# Patient Record
Sex: Male | Born: 1950 | Race: White | Hispanic: No | Smoking: Never smoker
Health system: Southern US, Community
[De-identification: ages and names within clinical notes are randomized; demographics above are authoritative.]

---

## 2011-09-14 ENCOUNTER — Encounter (HOSPITAL_COMMUNITY): Payer: Self-pay

## 2011-09-14 ENCOUNTER — Emergency Department (HOSPITAL_COMMUNITY): Payer: BC Managed Care – PPO

## 2011-09-14 ENCOUNTER — Emergency Department (HOSPITAL_COMMUNITY)
Admission: EM | Admit: 2011-09-14 | Discharge: 2011-09-15 | Disposition: A | Discharge status: Home / Self Care | Payer: BC Managed Care – PPO | Attending: Emergency Medicine | Admitting: Emergency Medicine

## 2011-09-14 DIAGNOSIS — M79609 Pain in unspecified limb: Secondary | ICD-10-CM | POA: Insufficient documentation

## 2011-09-14 DIAGNOSIS — M79606 Pain in leg, unspecified: Secondary | ICD-10-CM

## 2011-09-14 DIAGNOSIS — R079 Chest pain, unspecified: Secondary | ICD-10-CM | POA: Insufficient documentation

## 2011-09-14 DIAGNOSIS — Z880 Allergy status to penicillin: Secondary | ICD-10-CM | POA: Insufficient documentation

## 2011-09-14 DIAGNOSIS — B368 Other specified superficial mycoses: Secondary | ICD-10-CM | POA: Insufficient documentation

## 2011-09-14 LAB — COMPREHENSIVE METABOLIC PANEL
ALT: 12 U/L (ref 0–53)
AST: 16 U/L (ref 0–37)
Albumin: 4.1 g/dL (ref 3.5–5.2)
CO2: 23 mEq/L (ref 19–32)
Calcium: 9.6 mg/dL (ref 8.4–10.5)
Sodium: 135 mEq/L (ref 135–145)
Total Protein: 7.4 g/dL (ref 6.0–8.3)

## 2011-09-14 LAB — CBC WITH DIFFERENTIAL/PLATELET
Basophils Absolute: 0 10*3/uL (ref 0.0–0.1)
Basophils Relative: 0 % (ref 0–1)
Eosinophils Relative: 0 % (ref 0–5)
Lymphocytes Relative: 13 % (ref 12–46)
MCV: 89 fL (ref 78.0–100.0)
Platelets: 228 10*3/uL (ref 150–400)
RDW: 13 % (ref 11.5–15.5)
WBC: 10.6 10*3/uL — ABNORMAL HIGH (ref 4.0–10.5)

## 2011-09-14 MED ORDER — HYDROMORPHONE HCL PF 2 MG/ML IJ SOLN
2.0000 mg | Freq: Once | INTRAMUSCULAR | Status: AC
  Administered 2011-09-14: 2 mg via INTRAMUSCULAR
  Filled 2011-09-14: qty 1

## 2011-09-14 MED ORDER — ONDANSETRON 4 MG PO TBDP
8.0000 mg | ORAL_TABLET | Freq: Once | ORAL | Status: AC
  Administered 2011-09-14: 8 mg via ORAL
  Filled 2011-09-14: qty 2

## 2011-09-14 NOTE — ED Notes (Signed)
Pt complains of chest pain and leg pain, no pain at present in chest but legs do hurt, sts that pain started after working yesterday, was seen at clinic thismorning and given vicodin and antiinflammatory.

## 2011-09-14 NOTE — ED Provider Notes (Signed)
History     CSN: [id number]  Arrival date & time 09/14/11  1701   First MD Initiated Contact with Patient 09/14/11 2330      Chief Complaint  Patient presents with  . Chest Pain    (Consider location/radiation/quality/duration/timing/severity/associated sxs/prior treatment) HPI Comments: Cole Eastridge is a 61 y.o. Male who complains of lower leg pain bilaterally that started yesterday. He took ibuprofen and Aleve without relief. He went to a clinic today in [place] and was prescribed hydrocodone and meloxicam. Hydrocodone helps some but then the pain returns. He had an episode of chest pain, for 1 minute yesterday, while mowing the lawn. He has no associated shortness of breath, weakness, dizziness, nausea, or vomiting. There are no known aggravating or othe palliative factors.   Patient is a 61 y.o. male presenting with chest pain. The history is provided by the patient.  Chest Pain     No past medical history on file.  No past surgical history on file.  No family history on file.  History  Substance Use Topics  . Smoking status: Never Smoker   . Smokeless tobacco: Not on file  . Alcohol Use: No      Review of Systems  Cardiovascular: Positive for chest pain.  All other systems reviewed and are negative.    Allergies  Penicillins  Home Medications   Current Outpatient Rx  Name Route Sig Dispense Refill  . MELOXICAM 7.5 MG PO TABS Oral Take 7.5 mg by mouth 2 (two) times daily.    . OXYCODONE-ACETAMINOPHEN 5-325 MG PO TABS Oral Take 1 tablet by mouth every 4 (four) hours as needed for pain. 30 tablet 0    BP 132/74  Pulse 52  Temp 98.7 F (37.1 C) (Oral)  Resp 20  SpO2 96%  Physical Exam  Nursing note and vitals reviewed. Constitutional: He is oriented to person, place, and time. He appears well-developed and well-nourished.  HENT:  Head: Normocephalic and atraumatic.  Right Ear: External ear normal.  Left Ear: External ear normal.  Eyes:  Conjunctivae and EOM are normal. Pupils are equal, round, and reactive to light.  Neck: Normal range of motion and phonation normal. Neck supple.  Cardiovascular: Normal rate, regular rhythm, normal heart sounds and intact distal pulses.        2+ dorsalis pedis and posterior tibial pulses bilaterally  Pulmonary/Chest: Effort normal and breath sounds normal. He exhibits no bony tenderness.  Abdominal: Soft. Normal appearance. There is no tenderness.  Musculoskeletal: Normal range of motion. He exhibits no edema and no tenderness.       Legs appear normal bilaterally; no deformities, erythema, joint effusions.  Neurological: He is alert and oriented to person, place, and time. He has normal strength. No cranial nerve deficit or sensory deficit. He exhibits normal muscle tone. Coordination normal.  Skin: Skin is warm, dry and intact.       Dermatomycosis of the toenails bilaterally  Psychiatric: His behavior is normal. Judgment and thought content normal.       He appears depressed    ED Course  Procedures (including critical care time)  Labs Reviewed  CBC WITH DIFFERENTIAL - Abnormal; Notable for the following:    WBC 10.6 (*)     Neutrophils Relative 79 (*)     Neutro Abs 8.4 (*)     All other components within normal limits  COMPREHENSIVE METABOLIC PANEL - Abnormal; Notable for the following:    Glucose, Bld 120 (*)  All other components within normal limits  POCT I-STAT TROPONIN I  SEDIMENTATION RATE  CK  LAB REPORT - SCANNED   Dg Chest 2 View  09/14/2011  *RADIOLOGY REPORT*  Clinical Data: Chest pain.  CHEST - 2 VIEW  Comparison:  None.  Findings:  The heart size and mediastinal contours are within normal limits.  Both lungs are clear.  The visualized skeletal structures are unremarkable.  IMPRESSION: No active cardiopulmonary disease.   Original Report Authenticated By: Danae Orleans, M.D.      1. Leg pain       MDM  Nonspecific leg pain; ddx includes mild PVD,  radicular lumbar. Doubt spinal myelopathy, occult infection or asending neurologic abnormality.   Plan: Home Medications- Percocet; Home Treatments- rest; Recommended follow up- F/U PCP and Vascular surgery, he may need ABI studies.       Flint Melter, MD 09/15/11 Zollie Pee

## 2011-09-14 NOTE — ED Notes (Signed)
Doppler placed at bedside per Dr. Mable Paris request

## 2011-09-15 LAB — SEDIMENTATION RATE: Sed Rate: 2 mm/hr (ref 0–16)

## 2011-09-15 LAB — CK: Total CK: 214 U/L (ref 7–232)

## 2011-09-15 MED ORDER — OXYCODONE-ACETAMINOPHEN 5-325 MG PO TABS: 1.0000 | ORAL_TABLET | ORAL | Status: AC | PRN

## 2014-04-25 IMAGING — CR DG CHEST 2V
2 series · 2 of 2 positions shown · non-contrast
Comparison: None.

CLINICAL DATA: Chest pain.

CHEST - 2 VIEW

[w chest pa]
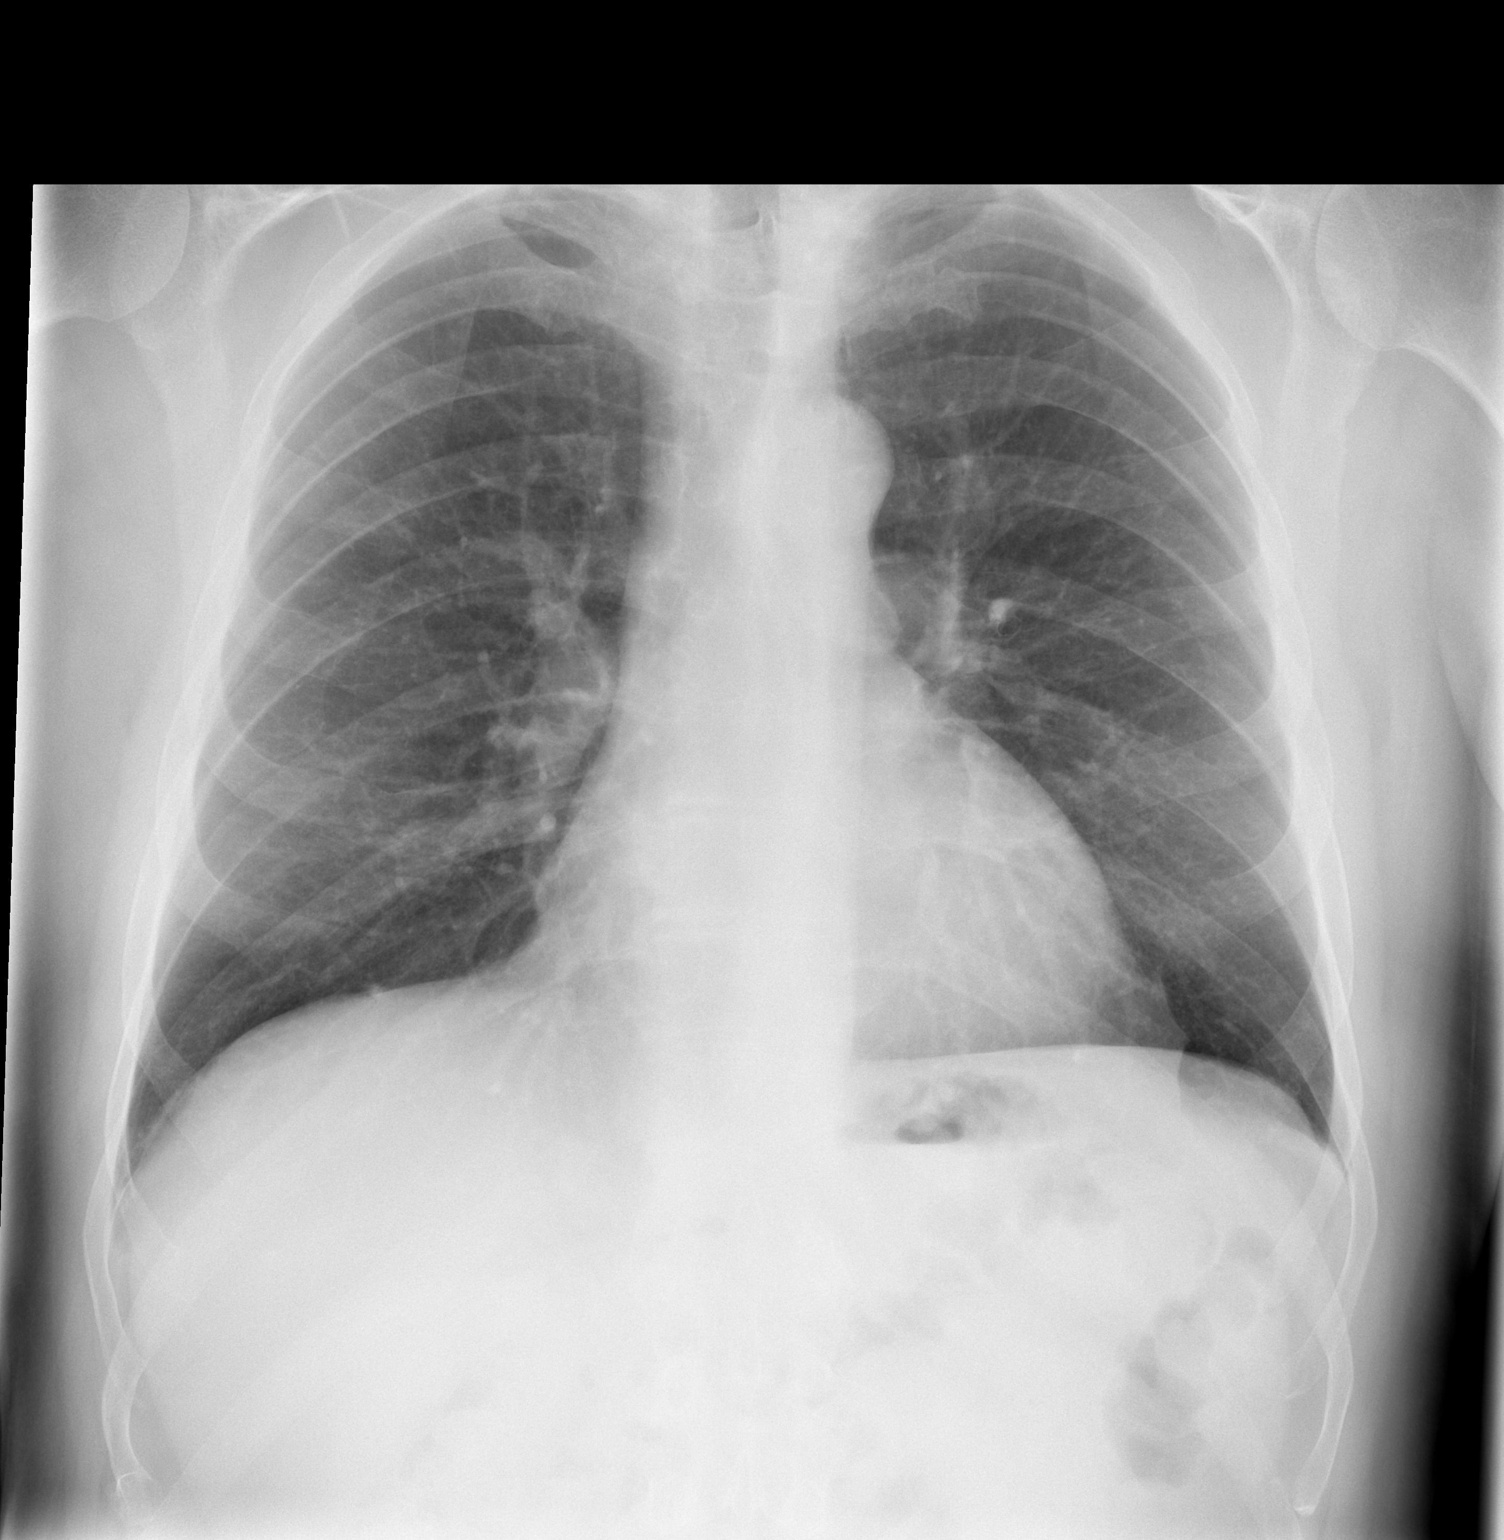

[w chest lat]
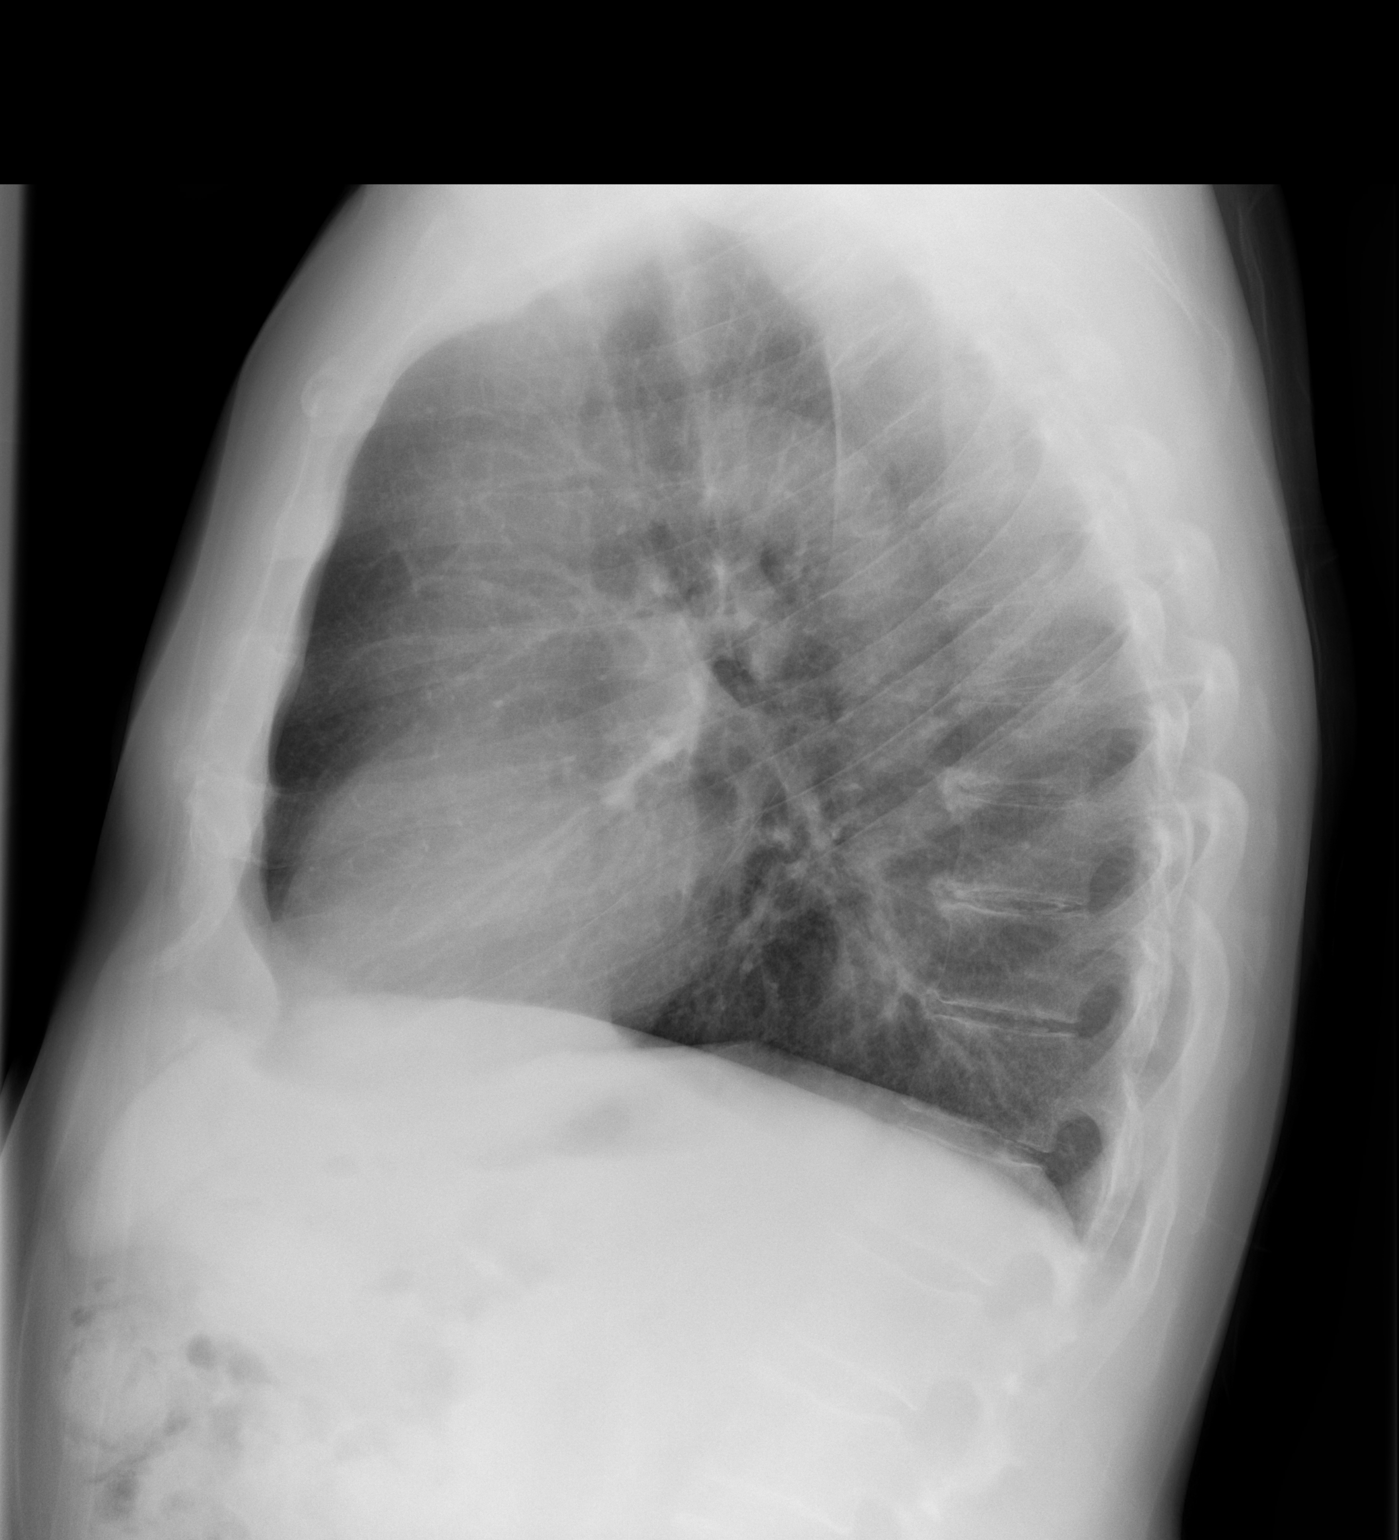

[2 of 2 positions shown; findings below may reference images not displayed]

FINDINGS: The heart size and mediastinal contours are within
normal limits.  Both lungs are clear.  The visualized skeletal
structures are unremarkable.
IMPRESSION: No active cardiopulmonary disease.

## 2021-08-09 ENCOUNTER — Emergency Department
Admission: EM | Admit: 2021-08-09 | Discharge: 2021-08-09 | Disposition: A | Discharge status: Home / Self Care | Payer: Medicare PPO | Attending: Emergency Medicine | Admitting: Emergency Medicine

## 2021-08-09 ENCOUNTER — Emergency Department: Payer: Medicare PPO

## 2021-08-09 ENCOUNTER — Emergency Department
Admission: EM | Disposition: A | Discharge status: Home / Self Care | Payer: Self-pay | Source: Home / Self Care | Attending: Emergency Medicine

## 2021-08-09 DIAGNOSIS — I472 Ventricular tachycardia, unspecified: Secondary | ICD-10-CM | POA: Diagnosis not present

## 2021-08-09 DIAGNOSIS — J9601 Acute respiratory failure with hypoxia: Secondary | ICD-10-CM | POA: Diagnosis not present

## 2021-08-09 DIAGNOSIS — R000 Tachycardia, unspecified: Secondary | ICD-10-CM | POA: Diagnosis present

## 2021-08-09 DIAGNOSIS — E538 Deficiency of other specified B group vitamins: Secondary | ICD-10-CM | POA: Diagnosis not present

## 2021-08-09 DIAGNOSIS — R570 Cardiogenic shock: Secondary | ICD-10-CM | POA: Diagnosis present

## 2021-08-09 DIAGNOSIS — I2109 ST elevation (STEMI) myocardial infarction involving other coronary artery of anterior wall: Secondary | ICD-10-CM | POA: Clinically undetermined

## 2021-08-09 DIAGNOSIS — R001 Bradycardia, unspecified: Secondary | ICD-10-CM | POA: Insufficient documentation

## 2021-08-09 DIAGNOSIS — R079 Chest pain, unspecified: Secondary | ICD-10-CM | POA: Diagnosis present

## 2021-08-09 DIAGNOSIS — Z85528 Personal history of other malignant neoplasm of kidney: Secondary | ICD-10-CM | POA: Diagnosis not present

## 2021-08-09 DIAGNOSIS — R0600 Dyspnea, unspecified: Secondary | ICD-10-CM | POA: Diagnosis not present

## 2021-08-09 DIAGNOSIS — E876 Hypokalemia: Secondary | ICD-10-CM | POA: Clinically undetermined

## 2021-08-09 HISTORY — PX: LEFT HEART CATH AND CORONARY ANGIOGRAPHY: CATH118249

## 2021-08-09 HISTORY — PX: CORONARY/GRAFT ACUTE MI REVASCULARIZATION: CATH118305

## 2021-08-09 LAB — CBC WITH DIFFERENTIAL/PLATELET
Abs Immature Granulocytes: 0.13 10*3/uL — ABNORMAL HIGH (ref 0.00–0.07)
Basophils Absolute: 0.1 10*3/uL (ref 0.0–0.1)
Basophils Relative: 0 %
Eosinophils Absolute: 0.2 10*3/uL (ref 0.0–0.5)
Eosinophils Relative: 1 %
HCT: 46.3 % (ref 39.0–52.0)
Hemoglobin: 14.7 g/dL (ref 13.0–17.0)
Immature Granulocytes: 1 %
Lymphocytes Relative: 26 %
Lymphs Abs: 5.1 10*3/uL — ABNORMAL HIGH (ref 0.7–4.0)
MCH: 30.6 pg (ref 26.0–34.0)
MCHC: 31.7 g/dL (ref 30.0–36.0)
MCV: 96.5 fL (ref 80.0–100.0)
Monocytes Absolute: 0.6 10*3/uL (ref 0.1–1.0)
Monocytes Relative: 3 %
Neutro Abs: 14 10*3/uL — ABNORMAL HIGH (ref 1.7–7.7)
Neutrophils Relative %: 69 %
Platelets: 242 10*3/uL (ref 150–400)
RBC: 4.8 MIL/uL (ref 4.22–5.81)
RDW: 13.2 % (ref 11.5–15.5)
WBC: 20.4 10*3/uL — ABNORMAL HIGH (ref 4.0–10.5)
nRBC: 0 % (ref 0.0–0.2)

## 2021-08-09 LAB — COMPREHENSIVE METABOLIC PANEL
ALT: 14 U/L (ref 0–44)
AST: 27 U/L (ref 15–41)
Albumin: 3.8 g/dL (ref 3.5–5.0)
Alkaline Phosphatase: 62 U/L (ref 38–126)
Anion gap: 11 (ref 5–15)
BUN: 20 mg/dL (ref 8–23)
CO2: 21 mmol/L — ABNORMAL LOW (ref 22–32)
Calcium: 8.8 mg/dL — ABNORMAL LOW (ref 8.9–10.3)
Chloride: 109 mmol/L (ref 98–111)
Creatinine, Ser: 1.75 mg/dL — ABNORMAL HIGH (ref 0.61–1.24)
GFR, Estimated: 41 mL/min — ABNORMAL LOW (ref >60)
Glucose, Bld: 276 mg/dL — ABNORMAL HIGH (ref 70–99)
Potassium: 2.8 mmol/L — ABNORMAL LOW (ref 3.5–5.1)
Sodium: 141 mmol/L (ref 135–145)
Total Bilirubin: 1.5 mg/dL — ABNORMAL HIGH (ref 0.3–1.2)
Total Protein: 7 g/dL (ref 6.5–8.1)

## 2021-08-09 LAB — LIPID PANEL
Cholesterol: 169 mg/dL (ref 0–200)
HDL: 39 mg/dL — ABNORMAL LOW (ref >40)
LDL Cholesterol: 105 mg/dL — ABNORMAL HIGH (ref 0–99)
Total CHOL/HDL Ratio: 4.3 RATIO
Triglycerides: 124 mg/dL (ref <150)
VLDL: 25 mg/dL (ref 0–40)

## 2021-08-09 LAB — BLOOD GAS, ARTERIAL
Acid-base deficit: 15.6 mmol/L — ABNORMAL HIGH (ref 0.0–2.0)
Bicarbonate: 14 mmol/L — ABNORMAL LOW (ref 20.0–28.0)
O2 Saturation: 59.9 %
Patient temperature: 37
pCO2 arterial: 46 mmHg (ref 32–48)
pH, Arterial: 7.09 — CL (ref 7.35–7.45)
pO2, Arterial: 43 mmHg — ABNORMAL LOW (ref 83–108)

## 2021-08-09 LAB — TROPONIN I (HIGH SENSITIVITY): Troponin I (High Sensitivity): 42 ng/L — ABNORMAL HIGH (ref <18)

## 2021-08-09 LAB — PROTIME-INR
INR: 1.1 (ref 0.8–1.2)
Prothrombin Time: 13.7 seconds (ref 11.4–15.2)

## 2021-08-09 LAB — TSH: TSH: 2.888 u[IU]/mL (ref 0.350–4.500)

## 2021-08-09 LAB — APTT: aPTT: 23 seconds — ABNORMAL LOW (ref 24–36)

## 2021-08-09 LAB — LACTIC ACID, PLASMA: Lactic Acid, Venous: 7.2 mmol/L (ref 0.5–1.9)

## 2021-08-09 LAB — MAGNESIUM: Magnesium: 2.3 mg/dL (ref 1.7–2.4)

## 2021-08-09 SURGERY — CORONARY/GRAFT ACUTE MI REVASCULARIZATION
Anesthesia: Moderate Sedation

## 2021-08-09 MED ORDER — SODIUM BICARBONATE 8.4 % IV SOLN
INTRAVENOUS | Status: AC
  Filled 2021-08-09: qty 150

## 2021-08-09 MED ORDER — ROCURONIUM BROMIDE 10 MG/ML (PF) SYRINGE
PREFILLED_SYRINGE | INTRAVENOUS | Status: AC
  Filled 2021-08-09: qty 10

## 2021-08-09 MED ORDER — NOREPINEPHRINE BITARTRATE 1 MG/ML IV SOLN: INTRAVENOUS | Status: DC | PRN

## 2021-08-09 MED ORDER — SODIUM CHLORIDE 0.9 % IV SOLN: INTRAVENOUS | Status: DC

## 2021-08-09 MED ORDER — ONDANSETRON HCL 4 MG/2ML IJ SOLN
INTRAMUSCULAR | Status: AC
  Filled 2021-08-09: qty 2

## 2021-08-09 MED ORDER — VASOPRESSIN 20 UNIT/ML IV SOLN
INTRAVENOUS | Status: AC
  Filled 2021-08-09: qty 1

## 2021-08-09 MED ORDER — ATROPINE SULFATE 1 MG/10ML IJ SOSY
PREFILLED_SYRINGE | INTRAMUSCULAR | Status: AC
  Filled 2021-08-09: qty 20

## 2021-08-09 MED ORDER — SODIUM BICARBONATE 8.4 % IV SOLN
INTRAVENOUS | Status: DC | PRN
  Administered 2021-08-09: 50 meq via INTRAVENOUS

## 2021-08-09 MED ORDER — AMIODARONE HCL 150 MG/3ML IV SOLN
INTRAVENOUS | Status: DC | PRN
  Administered 2021-08-09: 150 mg via INTRAVENOUS

## 2021-08-09 MED ORDER — EPINEPHRINE 1 MG/10ML IJ SOSY
PREFILLED_SYRINGE | INTRAMUSCULAR | Status: AC
  Filled 2021-08-09: qty 20

## 2021-08-09 MED ORDER — ONDANSETRON HCL 4 MG/2ML IJ SOLN
4.0000 mg | Freq: Once | INTRAMUSCULAR | Status: AC
  Administered 2021-08-09: 4 mg via INTRAVENOUS

## 2021-08-09 MED ORDER — FENTANYL CITRATE (PF) 100 MCG/2ML IJ SOLN
INTRAMUSCULAR | Status: AC
  Filled 2021-08-09: qty 2

## 2021-08-09 MED ORDER — NOREPINEPHRINE BITARTRATE 1 MG/ML IV SOLN
INTRAVENOUS | Status: AC
  Filled 2021-08-09: qty 4

## 2021-08-09 MED ORDER — VASOPRESSIN 20 UNITS/100 ML INFUSION FOR SHOCK: INTRAVENOUS | Status: DC | PRN

## 2021-08-09 MED ORDER — VERAPAMIL HCL 2.5 MG/ML IV SOLN
INTRAVENOUS | Status: AC
  Filled 2021-08-09: qty 2

## 2021-08-09 MED ORDER — EPINEPHRINE 1 MG/10ML IJ SOSY
PREFILLED_SYRINGE | INTRAMUSCULAR | Status: DC | PRN
  Administered 2021-08-09 (×2): 1 mg via INTRAVENOUS

## 2021-08-09 MED ORDER — LIDOCAINE HCL (PF) 1 % IJ SOLN
INTRAMUSCULAR | Status: DC | PRN
  Administered 2021-08-09: 30 mL

## 2021-08-09 MED ORDER — POTASSIUM CHLORIDE 10 MEQ/50ML IV SOLN
INTRAVENOUS | Status: DC | PRN
  Administered 2021-08-09: 20 meq via INTRAVENOUS

## 2021-08-09 MED ORDER — PHENYLEPHRINE HCL-NACL 20-0.9 MG/250ML-% IV SOLN: INTRAVENOUS | Status: DC | PRN

## 2021-08-09 MED ORDER — NOREPINEPHRINE 4 MG/250ML-% IV SOLN
INTRAVENOUS | Status: DC | PRN
  Administered 2021-08-09: 10 ug/min via INTRAVENOUS

## 2021-08-09 MED ORDER — EPINEPHRINE 1 MG/10ML IJ SOSY
PREFILLED_SYRINGE | INTRAMUSCULAR | Status: DC | PRN
  Administered 2021-08-09: .5 mg via INTRAVENOUS

## 2021-08-09 MED ORDER — MIDAZOLAM HCL 2 MG/2ML IJ SOLN
INTRAMUSCULAR | Status: AC
  Filled 2021-08-09: qty 2

## 2021-08-09 MED ORDER — HEPARIN SODIUM (PORCINE) 5000 UNIT/ML IJ SOLN: 4000.0000 [IU] | Freq: Once | INTRAMUSCULAR | Status: DC

## 2021-08-09 MED ORDER — LIDOCAINE HCL 1 % IJ SOLN
INTRAMUSCULAR | Status: AC
  Filled 2021-08-09: qty 20

## 2021-08-09 MED ORDER — ASPIRIN 81 MG PO CHEW
324.0000 mg | CHEWABLE_TABLET | Freq: Once | ORAL | Status: AC
  Administered 2021-08-09: 324 mg via ORAL

## 2021-08-09 MED ORDER — HEPARIN SODIUM (PORCINE) 1000 UNIT/ML IJ SOLN
INTRAMUSCULAR | Status: AC
  Filled 2021-08-09: qty 10

## 2021-08-09 MED ORDER — ETOMIDATE 2 MG/ML IV SOLN
INTRAVENOUS | Status: AC
  Filled 2021-08-09: qty 20

## 2021-08-09 MED ORDER — FENTANYL CITRATE (PF) 100 MCG/2ML IJ SOLN
INTRAMUSCULAR | Status: DC | PRN
  Administered 2021-08-09: 50 ug via INTRAVENOUS

## 2021-08-09 MED ORDER — FENTANYL CITRATE PF 50 MCG/ML IJ SOSY
PREFILLED_SYRINGE | INTRAMUSCULAR | Status: AC
  Filled 2021-08-09: qty 3

## 2021-08-09 MED ORDER — VASOPRESSIN 20 UNIT/ML IV SOLN: INTRAVENOUS | Status: DC | PRN

## 2021-08-09 MED FILL — Medication: Qty: 1 | Status: AC

## 2021-08-09 SURGICAL SUPPLY — 16 items
CATH INFINITI 5FR MULTPACK ANG (CATHETERS) ×1 IMPLANT
DRAPE BRACHIAL (DRAPES) ×1 IMPLANT
GLIDESHEATH SLEND SS 6F .021 (SHEATH) ×1 IMPLANT
GUIDEWIRE INQWIRE 1.5J.035X260 (WIRE) IMPLANT
INQWIRE 1.5J .035X260CM (WIRE) ×2
KIT ENCORE 26 ADVANTAGE (KITS) ×1 IMPLANT
NDL PERC 18GX7CM (NEEDLE) IMPLANT
NEEDLE PERC 18GX7CM (NEEDLE) ×2 IMPLANT
PACK CARDIAC CATH (CUSTOM PROCEDURE TRAY) ×2 IMPLANT
PROTECTION STATION PRESSURIZED (MISCELLANEOUS) ×2
SET ATX SIMPLICITY (MISCELLANEOUS) ×2 IMPLANT
SHEATH AVANTI 6FR X 11CM (SHEATH) ×3 IMPLANT
STATION PROTECTION PRESSURIZED (MISCELLANEOUS) IMPLANT
TUBING CIL FLEX 10 FLL-RA (TUBING) ×1 IMPLANT
WIRE GUIDERIGHT .035X150 (WIRE) ×1 IMPLANT
WIRE PACING TEMP ST TIP 5 (CATHETERS) ×2 IMPLANT

## 2021-08-10 ENCOUNTER — Encounter: Payer: Self-pay | Admitting: Cardiology

## 2021-08-10 LAB — HEMOGLOBIN A1C
Hgb A1c MFr Bld: 5.8 % — ABNORMAL HIGH (ref 4.8–5.6)
Mean Plasma Glucose: 119.76 mg/dL

## 2021-09-07 NOTE — Progress Notes (Signed)
   08-20-2021 1400  Clinical Encounter Type  Visited With Family  Visit Type ED;Code  Consult/Referral To Chaplain  Spiritual Encounters  Spiritual Needs Prayer;Emotional;Grief support   Chaplain responded to Code STEMI to assist and comfort family as their loved one Coded 3 times in Cath lab.

## 2021-09-07 NOTE — ED Provider Notes (Signed)
[facility] Department of Emergency Medicine   Code Blue CONSULT NOTE  Chief Complaint: Cardiac arrest/unresponsive   Level V Caveat: Unresponsive  History of present illness I was contacted by the Cath Lab for CODE BLUE  ROS: Unable to obtain, Level V caveat  Scheduled Meds:  fentaNYL       [MAR Hold] heparin  4,000 Units Intravenous Once   ondansetron       Continuous Infusions:  sodium chloride     norepinephrine (LEVOPHED) 4 mg in dextrose 5 % 250 mL (0.016 mg/mL) infusion Stopped (Aug 23, 2021 1353)   norepinephrine 30 mcg/min (08-23-21 1226)   phenylephrine (NEO-SYNEPHRINE) Adult infusion Stopped (23-Aug-2021 1355)   potassium chloride 20 mEq (23-Aug-2021 1317)   vasopressin Stopped (08-23-2021 1353)   vasopressin (PITRESSIN) 50 Units in dextrose 5 % 250 mL (0.2 Units/mL) infusion     PRN Meds:.amiodarone, EPINEPHrine, EPINEPHrine, fentaNYL, fentaNYL, lidocaine (PF), norepinephrine (LEVOPHED) 4 mg in dextrose 5 % 250 mL (0.016 mg/mL) infusion, norepinephrine, ondansetron, phenylephrine (NEO-SYNEPHRINE) Adult infusion, potassium chloride, sodium bicarbonate, vasopressin, vasopressin (PITRESSIN) 50 Units in dextrose 5 % 250 mL (0.2 Units/mL) infusion No past medical history on file.  Social History   Socioeconomic History   Marital status: Unknown    Spouse name: Not on file   Number of children: Not on file   Years of education: Not on file   Highest education level: Not on file  Occupational History   Not on file  Tobacco Use   Smoking status: Never   Smokeless tobacco: Not on file  Substance and Sexual Activity   Alcohol use: No   Drug use: No   Sexual activity: Not on file  Other Topics Concern   Not on file  Social History Narrative   Not on file   Social Determinants of Health   Financial Resource Strain: Not on file  Food Insecurity: Not on file  Transportation Needs: Not on file  Physical Activity: Not on file  Stress: Not on file   Social Connections: Not on file  Intimate Partner Violence: Not on file   Allergies  Allergen Reactions   Penicillins     Last set of Vital Signs (not current) Vitals:   08-23-21 1349 23-Aug-2021 1351  BP: (!) 55/45 (!) 71/40  Pulse: 82 67  Resp: (!) 24 (!) 22  Temp:    SpO2: (!) 82% (!) 64%      Physical Exam  Gen: Patient able to speak Cardiovascular: pulse Resp: Breath sounds equal bilaterally assisted with bagging  Procedures (when applicable, including Critical Care time): Procedure Name: Intubation Date/Time: Aug 23, 2021 3:30 PM  Performed by: Concha Se, MDPre-anesthesia Checklist: Patient identified Oxygen Delivery Method: Simple face mask Laryngoscope Size: Glidescope Tube size: 8.0 mm Number of attempts: 1 Tube secured with: Tape Difficulty Due To: Difficulty was unanticipated       MDM / Assessment and Plan Patient lost pulses but upon my arrival patient had gained pulses back he was able to say his name but was hypoxic and ventilations were being assisted therefore after discussion with the Cath Lab team we decided to proceed with intubation.    Concha Se, MD 08/23/21 [phone]

## 2021-09-07 NOTE — Significant Event (Signed)
Contacted Medical Examiner Tenna Delaine. He declined as a case. Explained circumstances of patient course, and declined

## 2021-09-07 NOTE — H&P (Signed)
Brief History and Physical Note:  NAME:  Manuel Salinas   MRN: [id number] DOB:  1950/01/20   ADMIT DATE: Aug 10, 2021   2021-08-10 2:29 PM   HPI: Manuel Salinas is a 70 y.o. 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 as a possible code STEMI.  Upon arrival to the patient's house, EMS indicated that he was diaphoretic and noting chest tightness and dyspnea.  He was borderline hypotensive, and somewhat hypoxic but responded to oxygen initially.  He then had what appeared to be V. tach which was treated with cardioversion and 2 rounds of lidocaine.  Upon arrival to [facility] ER his blood pressures were in the 70s to 80s and then was being assessed by EDP and upon my arrival cardiology.  His blood pressures steadily declining and was being started on Levophed, however he was on nonrebreather mask and somewhat verbal.  When his pressures went into the 50s he was given a half amp of epinephrine which brought his pressures up into the 90s while the norepinephrine was being initiated.  Shortly after the epinephrine was given the pressures came up and he began having irregular heart rhythms with wide-complex beats which was his baseline making it very difficult to terminate VT versus SVT or sinus tachycardia.  He was given 150 mg of amiodarone which did seem to stabilize his rhythm.  He then had a significant episode of emesis without any aspiration.  He was still somewhat responsive and his pressures were in the 90s and therefore I felt it was safe to take the patient to the Cath Lab.  In discussing with the EDP we both agreed that intubation may potentially cause him to lose his adrenergic drive and cause for further hypotension.   Past medical history: History of renal cell carcinoma with nephrectomy.  Vitamin B12 deficiency.  Borderline renal insufficiency.  No other known  history  FAMHx: No family history on file. Only documented history was arthritis and cancer  SOCHx:  reports that he has never smoked. He does not have any smokeless tobacco history on file. He reports that he does not drink alcohol and does not use drugs.  ALLERGIES: Allergies  Allergen Reactions   Penicillins     HOME MEDICATIONS: Medications Prior to Admission  Medication Sig Dispense Refill Last Dose   meloxicam (MOBIC) 7.5 MG tablet Take 7.5 mg by mouth 2 (two) times daily. (Patient not taking: Reported on 08-10-21)   Not Taking  Vitamin B12 and baby aspirin  Review of Systems  Unable to perform ROS: Critical illness  Constitutional:  Positive for malaise/fatigue.  Respiratory:  Positive for shortness of breath.   Cardiovascular:  Positive for chest pain.     PHYSICAL EXAM:Blood pressure (!) 51/41, pulse (!) 51, temperature (!) 89 F (31.7 C), resp. rate (!) 22, SpO2 94 %. General appearance: Awake and somewhat responsive.  However lethargic.  Ill-appearing. Neck: Unable to really assess JVP but does appear to be somewhat elevated. Lungs: Mildly labored, bibasal rales. Heart: Irregular rhythm.  Difficult to assess murmurs or rubs.  No obvious gallops. Abdomen: Soft/NT/ND.  Mild ventral hernia. Extremities: No edema.  Somewhat mottled pulses. Pulses: diminished pulses - weak  Skin: mottled, & pale Neurologic: Mental status: Alert, oriented, answers questions minimally.   Adult ECG Report Multiple EKGs were available mostly wide-complex with rates in the 70s to 90s.  I did not see EKG that showed ST elevations in lead to the code STEMI being called.  He had a clear agonal rhythm  IMPRESSION & PLAN The patients' history has been reviewed, patient examined, no change in status from most recent note, stable for surgery. I have reviewed the patients' chart and labs. Questions were answered to the patient's satisfaction.    Tracey Harries has presented today for  Cardiac Catheterization, with the diagnosis of likely acute coronary syndrome with unstable heart rhythms concerning for possible anterior STEMI.. The various methods of treatment have been discussed with the patient and family.  Emergency consent implied   After consideration of risks, benefits and other options for treatment, the patient has consented to Procedure(s):  LEFT HEART CATHETERIZATION AND CORONARY ANGIOGRAPHY +/- AD HOC PERCUTANEOUS CORONARY INTERVENTION RIGHT HEART CATHETERIZATION Temporary pacemaker placement Possible mechanical support-IABP versus Impella  as a surgical intervention.   We will proceed with the planned procedure.   Bryan Lemma [facility] [address] [place], [place]  [address]  [phone]  Aug 20, 2021 2:29 PM

## 2021-09-07 NOTE — Progress Notes (Signed)
After patient was declared dead, and the MD spoke with family, decedent was transported to ICU so the family would be able to view him.

## 2021-09-07 NOTE — Code Documentation (Signed)
Dr. Katrinka Blazing weaned pt off pacing pt. Is NSR with rate of 60

## 2021-09-07 NOTE — Code Documentation (Signed)
Cards to bedside

## 2021-09-07 NOTE — ED Provider Notes (Signed)
[facility] Provider Note    Event Date/Time   First MD Initiated Contact with Patient August 22, 2021 1215     (approximate)   History   Code STEMI (Pt. To ED via EMS, CP with hypotension. Ttach with pulse, 200mg  lido, cardioversion x1, Epi "over time" per medic. Pt. Became unresponsive, began pacing.  4L [place] enroute.)   HPI  Manuel Salinas is a 71 y.o. male without significant past medical history who presents via EMS for evaluation of fairly sudden onset of chest pain earlier this morning.  Patient reportedly was found to be in V. tach on arrival hypotensive and hypoxic.  He was placed on nasal cannula and a synchronized cardioversion was performed without change in his rhythm.  He was subsequently given lidocaine and became more bradycardic and hypotensive and was given some push dose epinephrine as well.  Transcutaneous pacing was subsequently initiated as he was still bradycardic.  He was transported on nasal cannula and with transcutaneous pacing labs and paced pacing at approximately 70 bpm.  On arrival patient's endorsing some chest pain stating it began earlier today.  He denies any cardiac history.  Denies any other sick symptoms such as fevers, chills, cough, nausea, vomiting or diarrhea or rash.  He denies any falls or injuries.  He denies any tobacco use EtOH use or illicit drug use.  He is not on any blood thinners.   No past medical history on file.   Physical Exam  Triage Vital Signs: ED Triage Vitals  Enc Vitals Group     BP 08/22/21 1202 129/73     Pulse Rate 2021/08/22 1158 70     Resp 2021-08-22 1159 (!) 31     Temp --      Temp src --      SpO2 08-22-21 1158 91 %     Weight --      Height --      Head Circumference --      Peak Flow --      Pain Score --      Pain Loc --      Pain Edu? --      Excl. in GC? --     Most recent vital signs: Vitals:   2021-08-22 1223 08-22-21 1225  BP: (!) 74/61 (!) 51/41  Pulse: 82 (!) 51  Resp: (!)  22   Temp:  (!) 89 F (31.7 C)  SpO2: 98% 94%    General: Awake, cool clammy and toxic appearing. CV:  Cardiac with a regular rhythm.  No audible murmur at this time.  Prolonged capillary refill in the digits. Resp:  Normal effort.  Slightly diminished but clear. Abd:  No distention.  Soft. Other:  Remedies are cool and clammy.  No significant lower extremity.  Patient is GCS of 15.  He is awake and alert and speaking and able to follow commands in all extremities   ED Results / Procedures / Treatments  Labs (all labs ordered are listed, but only abnormal results are displayed) Labs Reviewed  CBC WITH DIFFERENTIAL/PLATELET - Abnormal; Notable for the following components:      Result Value   WBC 20.4 (*)    Neutro Abs 14.0 (*)    Lymphs Abs 5.1 (*)    Abs Immature Granulocytes 0.13 (*)    All other components within normal limits  APTT - Abnormal; Notable for the following components:   aPTT 23 (*)    All other components  within normal limits  COMPREHENSIVE METABOLIC PANEL - Abnormal; Notable for the following components:   Potassium 2.8 (*)    CO2 21 (*)    Glucose, Bld 276 (*)    Creatinine, Ser 1.75 (*)    Calcium 8.8 (*)    Total Bilirubin 1.5 (*)    GFR, Estimated 41 (*)    All other components within normal limits  LIPID PANEL - Abnormal; Notable for the following components:   HDL 39 (*)    LDL Cholesterol 105 (*)    All other components within normal limits  LACTIC ACID, PLASMA - Abnormal; Notable for the following components:   Lactic Acid, Venous 7.2 (*)    All other components within normal limits  TROPONIN I (HIGH SENSITIVITY) - Abnormal; Notable for the following components:   Troponin I (High Sensitivity) 42 (*)    All other components within normal limits  RESP PANEL BY RT-PCR (FLU A&B, COVID) ARPGX2  PROTIME-INR  MAGNESIUM  HEMOGLOBIN A1C  TSH  LACTIC ACID, PLASMA  BLOOD GAS, ARTERIAL     EKG  Arrival EKG is remarkable for appears to be a  junctional rhythm with a right bundle branch block and left anterior fascicle block with a ventricular rate of 62 and fairly diffuse nonspecific ST changes concerning for ischemia.   RADIOLOGY     PROCEDURES:  Critical Care performed: Yes, see critical care procedure note(s)  .Critical Care  Performed by: Gilles Chiquito, MD Authorized by: Gilles Chiquito, MD   Critical care provider statement:    Critical care time (minutes):  15   Critical care was time spent personally by me on the following activities:  Development of treatment plan with patient or surrogate, discussions with consultants, evaluation of patient's response to treatment, examination of patient, ordering and review of laboratory studies, ordering and review of radiographic studies, ordering and performing treatments and interventions, pulse oximetry, re-evaluation of patient's condition and review of old charts     MEDICATIONS ORDERED IN ED: Medications  fentaNYL (SUBLIMAZE) 50 MCG/ML injection (has no administration in time range)  fentaNYL (SUBLIMAZE) injection (50 mcg Intravenous Given 08-21-2021 1200)  norepinephrine (LEVOPHED) 4mg  in (0.016 mg/mL) premix infusion (30 mcg/min Intravenous Rate/Dose Change 08-21-2021 1226)  EPINEPHrine (ADRENALIN) 1 MG/10ML injection (0.5 mg Intravenous Given August 21, 2021 1212)  0.9 %  sodium chloride infusion (has no administration in time range)  heparin injection 4,000 Units ( Intravenous Automatically Held 08/21/2021 1230)  amiodarone (CORDARONE) injection (150 mg Intravenous Given 2021/08/21 1220)  ondansetron (ZOFRAN) 4 MG/2ML injection (has no administration in time range)  norepinephrine (LEVOPHED) 4 mg in dextrose 5 % 250 mL (0.016 mg/mL) infusion (60 mcg/min Intravenous New Bag/Given 08-21-2021 1235)  lidocaine (PF) (XYLOCAINE) 1 % injection (30 mLs Infiltration Given 08-21-21 1305)  aspirin chewable tablet 324 mg (324 mg Oral Given 08/21/21 1157)  ondansetron (ZOFRAN) injection 4 mg (4 mg  Intravenous Given 08-21-21 1222)     IMPRESSION / MDM / ASSESSMENT AND PLAN / ED COURSE  I reviewed the triage vital signs and the nursing notes. Patient's presentation is most consistent with acute presentation with potential threat to life or bodily function.                               Differential diagnosis includes, but is not limited to ACS, sepsis, PE, arrhythmia, metabolic derangements, endocrine derangements, pericardial effusion and tamponade physiology and sepsis.  CBC with  WBC count 20.4 with normal hemoglobin and platelets of 240.  INR 1.1.  PTT 23.  CMP is remarkable for K of 2.8, glucose of 276, creatinine of 1.75 without recent for comparison without other significant derangements.  Initial troponin is 42.  Magnesium 2.3.  Lactic acid 7.2.  On arrival patient was transitioned to ED pacer pads and subsequently transitioned off electrical pacing.  He was known to have heart rate around 60s and continued to be awake and alert.  He was toxic and was placed on nonrebreather and was hypotensive.  Levophed was ordered.  He was noted to be in a wide-complex rhythm and with cardiology at bedside he was given 150 mcg of amiodarone as well as half dose "epi per cardiology recommendations.  He was emergently transferred to Cath Lab STEMI had been paged out shortly prior to arrival.       FINAL CLINICAL IMPRESSION(S) / ED DIAGNOSES   Final diagnoses:  Cardiogenic shock (HCC)     Rx / DC Orders   ED Discharge Orders     None        Note:  This document was prepared using Dragon voice recognition software and may include unintentional dictation errors.   Gilles Chiquito, MD 09-08-2021 1310

## 2021-09-07 NOTE — Death Summary Note (Addendum)
Death Summary    Patient ID: Manuel Salinas MRN: [id number]; DOB: 1950/11/11  Admit Date: 08/10/21 Date of Death: 08-10-2021  Primary Care Provider: Default, Provider, MD  Primary Cardiologist: New to [facility]- Dr Herbie [place] consulted Primary Electrophysiologist:  None   Discharge Diagnoses    Principal Problem:   ST elevation myocardial infarction (STEMI) of anterior wall (HCC) - Presumed by OOH EKG Active Problems:   Wide-complex tachycardia   Cardiogenic shock (HCC)   Acute respiratory failure with hypoxia and hypercapnia (HCC)   Acute hypokalemia  Diagnostic Studies/Procedures    CPR Bilateral CFA Access RFV Central Line Access with Temporary PPM placed.  _____________   History of Present Illness     Manuel Salinas is a 71 y.o. male with a past medical history of renal cancer, measles, mumps, hyperglycemia, B12 deficiency and kidney stones who was brought into the [facility] emergency department via EMS with complaints of chest pain with hypotension, V-tach with being given 200 mg of lidocaine and defibrillation x 1, 15 mcg of epip, and transcutaneous pacing.  Hospital Course   Patient is 71 year old male without significant past medical history who presented via EMS for fairly sudden onset of chest pain earlier this morning.  He reportedly was found to be in V. tach on arrival, hypotensive, and hypoxic by EMS.  He was placed on a nasal cannula at 4 L and had synchronized cardioversion was performed without change in his rhythm.  He was also given 200 mg of lidocaine by EMS prior to arrival.  He became more bradycardic and hypotensive and was given a total of 50 mcg of epinephrine through transit via EMS from his home to the hospital setting.  He was placed on transcutaneous pacing pads and paced at approximately 70 bpm.  On arrival the patient was endorsing chest pain stating it began earlier today.  He he denied any cardiac history.  Denied any other sick symptoms in the emergency  department.  Initial vitals in the emergency room pressure 74/61, pulse 82, respirations of 22, temperature of 89 degrees F.  Pertinent labs on arrival WBCs of 20.4, potassium 2.8, CO2 21, glucose 276, serum creatinine 1.75, calcium 8.8, GFR 41, lactic acid of 7.2, high-sensitivity troponin of 42.  There were no EKGs available.  No imaging was completed.  Patient was placed on Levophed drip.  Was then noted to be in wide-complex rhythm.  He was given 150 mg of amiodarone and half amp of epi.  Patient was brought to the Cath Lab by emergency department staff and become unresponsive and pulseless with noted wide rhythm electrical activity on the cardiac monitor.  He subsequently was intubated and a code was called initiated CPR with ACLS medications given.  Initial code was called at 1230 after CPR and intubation medications were given.  Cardiac catheterization access was initiated and patient became pulseless and required to do CPR intervention were again at that time.  He was then given another round of ACLS medications was placed on several vasoactive drips as and vasopressin, Neo-Synephrine, epinephrine, and was priorly placed on amiodarone.  He briefly began regained a pulse that was short-lived and dropped his pressure down and became pulseless again and required more CPR and more ACLS medications.  The transcutaneous pacing was stopped at that time to assess his rhythm looked.  He was in V. tach he received 1 shock at 200 J and then was initiated with pacing once again.  A few minutes after he again then  did become pulseless again and CPR was initiated again and he was given more ACLS medications.  He then had agonal respirations PEA again on the monitor unfortunately with several rounds of CPR and medications ultimately he had no resumption of a pulse continued with no blood pressure no respirations and no oxygen saturation in the time of death was called at 1352 by Dr. Herbie [place].  Consultants:  Cardiology  Time of death: April 22, 1350 _____________  Labs & Radiologic Studies    CBC Recent Labs    08/18/2021 1200  WBC 20.4*  NEUTROABS 14.0*  HGB 14.7  HCT 46.3  MCV 96.5  PLT 242   Basic Metabolic Panel Recent Labs    65/03/54 1200  NA 141  K 2.8*  CL 109  CO2 21*  GLUCOSE 276*  BUN 20  CREATININE 1.75*  CALCIUM 8.8*  MG 2.3   Liver Function Tests Recent Labs    Aug 18, 2021 1200  AST 27  ALT 14  ALKPHOS 62  BILITOT 1.5*  PROT 7.0  ALBUMIN 3.8   No results for input(s): "LIPASE", "AMYLASE" in the last 72 hours. High Sensitivity Troponin:   Recent Labs  Lab 08-18-21 1200  TROPONINIHS 42*    BNP Invalid input(s): "POCBNP" D-Dimer No results for input(s): "DDIMER" in the last 72 hours. Hemoglobin A1C No results for input(s): "HGBA1C" in the last 72 hours. Fasting Lipid Panel Recent Labs    08/18/21 1200  CHOL 169  HDL 39*  LDLCALC 105*  TRIG 124  CHOLHDL 4.3   Thyroid Function Tests Recent Labs    08/18/21 1200  TSH 2.888   _____________  No results found.  Duration of Death Summary Encounter   Greater than 30 minutes including physician time.  Transcribed by Charlsie Quest, NP 08-18-2021 2:24 PM    Signed, Bryan Lemma, MD Aug 18, 2021, 9:55 PM

## 2021-09-07 DEATH — deceased
